# Patient Record
Sex: Female | Born: 1970 | Race: Asian | Hispanic: No | Marital: Single | Smoking: Never smoker
Health system: Southern US, Community
[De-identification: ages and names within clinical notes are randomized; demographics above are authoritative.]

## PROBLEM LIST (undated history)

## (undated) DIAGNOSIS — E079 Disorder of thyroid, unspecified: Secondary | ICD-10-CM

---

## 2003-08-30 ENCOUNTER — Emergency Department (HOSPITAL_COMMUNITY): Admission: EM | Admit: 2003-08-30 | Discharge: 2003-08-30 | Payer: Self-pay | Admitting: Emergency Medicine

## 2010-01-28 ENCOUNTER — Encounter: Admission: RE | Admit: 2010-01-28 | Discharge: 2010-01-28 | Payer: Self-pay | Admitting: Internal Medicine

## 2010-07-19 ENCOUNTER — Encounter: Payer: Self-pay | Admitting: Internal Medicine

## 2012-04-21 ENCOUNTER — Other Ambulatory Visit: Payer: Self-pay | Admitting: Internal Medicine

## 2012-04-21 DIAGNOSIS — Z1231 Encounter for screening mammogram for malignant neoplasm of breast: Secondary | ICD-10-CM

## 2012-06-02 ENCOUNTER — Ambulatory Visit
Admission: RE | Admit: 2012-06-02 | Discharge: 2012-06-02 | Disposition: A | Discharge status: Home / Self Care | Payer: PRIVATE HEALTH INSURANCE | Source: Ambulatory Visit | Attending: Internal Medicine | Admitting: Internal Medicine

## 2012-06-02 DIAGNOSIS — Z1231 Encounter for screening mammogram for malignant neoplasm of breast: Secondary | ICD-10-CM

## 2013-11-24 ENCOUNTER — Other Ambulatory Visit: Payer: Self-pay

## 2013-11-24 DIAGNOSIS — Z1231 Encounter for screening mammogram for malignant neoplasm of breast: Secondary | ICD-10-CM

## 2013-12-04 ENCOUNTER — Ambulatory Visit
Admission: RE | Admit: 2013-12-04 | Discharge: 2013-12-04 | Disposition: A | Discharge status: Home / Self Care | Payer: PRIVATE HEALTH INSURANCE | Source: Ambulatory Visit

## 2013-12-04 DIAGNOSIS — Z1231 Encounter for screening mammogram for malignant neoplasm of breast: Secondary | ICD-10-CM

## 2013-12-06 ENCOUNTER — Other Ambulatory Visit: Payer: Self-pay | Admitting: Internal Medicine

## 2013-12-06 DIAGNOSIS — R928 Other abnormal and inconclusive findings on diagnostic imaging of breast: Secondary | ICD-10-CM

## 2014-01-24 ENCOUNTER — Other Ambulatory Visit: Payer: PRIVATE HEALTH INSURANCE

## 2014-05-17 ENCOUNTER — Encounter (INDEPENDENT_AMBULATORY_CARE_PROVIDER_SITE_OTHER): Payer: Self-pay

## 2014-05-17 ENCOUNTER — Ambulatory Visit
Admission: RE | Admit: 2014-05-17 | Discharge: 2014-05-17 | Disposition: A | Discharge status: Home / Self Care | Payer: Managed Care, Other (non HMO) | Source: Ambulatory Visit | Attending: Internal Medicine | Admitting: Internal Medicine

## 2014-05-17 DIAGNOSIS — R928 Other abnormal and inconclusive findings on diagnostic imaging of breast: Secondary | ICD-10-CM

## 2017-07-16 ENCOUNTER — Other Ambulatory Visit: Payer: Self-pay | Admitting: Internal Medicine

## 2017-07-16 DIAGNOSIS — N6489 Other specified disorders of breast: Secondary | ICD-10-CM

## 2017-08-02 ENCOUNTER — Encounter: Payer: Self-pay | Admitting: Radiology

## 2017-08-02 ENCOUNTER — Ambulatory Visit
Admission: RE | Admit: 2017-08-02 | Discharge: 2017-08-02 | Disposition: A | Discharge status: Home / Self Care | Payer: 59 | Source: Ambulatory Visit | Attending: Internal Medicine | Admitting: Internal Medicine

## 2017-08-02 DIAGNOSIS — N6489 Other specified disorders of breast: Secondary | ICD-10-CM

## 2019-01-19 ENCOUNTER — Encounter (HOSPITAL_COMMUNITY): Payer: Self-pay | Admitting: Emergency Medicine

## 2019-01-19 ENCOUNTER — Other Ambulatory Visit: Payer: Self-pay

## 2019-01-19 ENCOUNTER — Emergency Department (HOSPITAL_COMMUNITY): Payer: 59

## 2019-01-19 ENCOUNTER — Emergency Department (HOSPITAL_COMMUNITY)
Admission: EM | Admit: 2019-01-19 | Discharge: 2019-01-20 | Disposition: A | Discharge status: Home / Self Care | Payer: 59 | Attending: Emergency Medicine | Admitting: Emergency Medicine

## 2019-01-19 DIAGNOSIS — E039 Hypothyroidism, unspecified: Secondary | ICD-10-CM | POA: Insufficient documentation

## 2019-01-19 DIAGNOSIS — R51 Headache: Secondary | ICD-10-CM | POA: Diagnosis not present

## 2019-01-19 DIAGNOSIS — R0789 Other chest pain: Secondary | ICD-10-CM | POA: Diagnosis not present

## 2019-01-19 DIAGNOSIS — R42 Dizziness and giddiness: Secondary | ICD-10-CM

## 2019-01-19 DIAGNOSIS — R519 Headache, unspecified: Secondary | ICD-10-CM

## 2019-01-19 HISTORY — DX: Disorder of thyroid, unspecified: E07.9

## 2019-01-19 LAB — CBC
HCT: 37.4 % (ref 36.0–46.0)
Hemoglobin: 12.3 g/dL (ref 12.0–15.0)
MCH: 30.3 pg (ref 26.0–34.0)
MCHC: 32.9 g/dL (ref 30.0–36.0)
MCV: 92.1 fL (ref 80.0–100.0)
Platelets: 347 10*3/uL (ref 150–400)
RBC: 4.06 MIL/uL (ref 3.87–5.11)
RDW: 12.6 % (ref 11.5–15.5)
WBC: 6.5 10*3/uL (ref 4.0–10.5)
nRBC: 0 % (ref 0.0–0.2)

## 2019-01-19 LAB — BASIC METABOLIC PANEL
Anion gap: 9 (ref 5–15)
BUN: 15 mg/dL (ref 6–20)
CO2: 23 mmol/L (ref 22–32)
Calcium: 8.8 mg/dL — ABNORMAL LOW (ref 8.9–10.3)
Chloride: 103 mmol/L (ref 98–111)
Creatinine, Ser: 0.69 mg/dL (ref 0.44–1.00)
GFR calc Af Amer: >60 mL/min (ref >60)
GFR calc non Af Amer: >60 mL/min (ref >60)
Glucose, Bld: 127 mg/dL — ABNORMAL HIGH (ref 70–99)
Potassium: 3.4 mmol/L — ABNORMAL LOW (ref 3.5–5.1)
Sodium: 135 mmol/L (ref 135–145)

## 2019-01-19 LAB — I-STAT BETA HCG BLOOD, ED (MC, WL, AP ONLY): I-stat hCG, quantitative: <5 m[IU]/mL (ref <5)

## 2019-01-19 LAB — TROPONIN I (HIGH SENSITIVITY): Troponin I (High Sensitivity): <2 ng/L (ref <18)

## 2019-01-19 MED ORDER — SODIUM CHLORIDE 0.9% FLUSH: 3.0000 mL | Freq: Once | INTRAVENOUS | Status: DC

## 2019-01-19 NOTE — ED Triage Notes (Signed)
Pt reports feeling lightheaded and head pressure starting 4 hours ago. Pt reports central chest pressure with no radiation. Pt reports nausea with 1 episode of vomiting. Pt reports hx of hypothyroidism.

## 2019-01-20 LAB — TROPONIN I (HIGH SENSITIVITY): Troponin I (High Sensitivity): <2 ng/L (ref <18)

## 2019-01-20 MED ORDER — DEXAMETHASONE SODIUM PHOSPHATE 10 MG/ML IJ SOLN
10.0000 mg | Freq: Once | INTRAMUSCULAR | Status: AC
  Administered 2019-01-20: 03:00:00 10 mg via INTRAVENOUS
  Filled 2019-01-20: qty 1

## 2019-01-20 MED ORDER — DIPHENHYDRAMINE HCL 50 MG/ML IJ SOLN
25.0000 mg | Freq: Once | INTRAMUSCULAR | Status: AC
  Administered 2019-01-20: 03:00:00 25 mg via INTRAVENOUS
  Filled 2019-01-20: qty 1

## 2019-01-20 MED ORDER — PROCHLORPERAZINE EDISYLATE 10 MG/2ML IJ SOLN
10.0000 mg | Freq: Once | INTRAMUSCULAR | Status: AC
  Administered 2019-01-20: 03:00:00 10 mg via INTRAVENOUS
  Filled 2019-01-20: qty 2

## 2019-01-20 MED ORDER — SODIUM CHLORIDE 0.9 % IV BOLUS
500.0000 mL | Freq: Once | INTRAVENOUS | Status: AC
  Administered 2019-01-20: 03:00:00 500 mL via INTRAVENOUS

## 2019-01-20 NOTE — ED Notes (Signed)
IV placed in R Hand. Patient is resting comfortably in bed with pain subsiding from a 7 to a 3.

## 2019-01-20 NOTE — ED Provider Notes (Signed)
[facility]  CSN: [id number] Arrival date & time: 01/19/19 2052  Chief Complaint(s) Dizziness and Chest Pain  HPI Lindsay Atkinson is a 48 y.o. female who presents to the emergency department with frontal headache and dizziness.  Patient also reports having substernal chest pressure while waiting in the emergency department waiting room.  Headache is frontal and calvarial pressure.  Endorses photophobia.  No phonophobia.  Reports history of similar headaches in the past.  Denies any recent fevers or infections.  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  She reports being tested weekly and just received a negative test.  She denies any nasal congestion, cough.  No visual disturbance no focal deficits.  Pain has improved while waiting in the emergency department, but not resolved.  Still has dizziness described as lightheadedness upon standing and sitting up.  Reports drinking in lots of coffee and soda and minimal water.  Patient endorsed one episode of nausea with nonbloody nonbilious emesis.  Chest pain was described as substernal pressure that was nonradiating.  Lasted about 5 minutes.  Nonexertional.  No associated shortness of breath.  Resolved spontaneously.  This was following episode of emesis.  No prior cardiac history.   HPI  Past Medical History Past Medical History:  Diagnosis Date  . Thyroid disease    hypothyroid   There are no active problems to display for this patient.  Home Medication(s) Prior to Admission medications   Medication Sig Start Date End Date Taking? Authorizing Provider  Cholecalciferol (VITAMIN D) 50 MCG (2000 UT) tablet Take 2,000 Units by mouth daily.   Yes [provider]  traMADol (ULTRAM) 50 MG tablet Take 50 mg by mouth every 6 (six) hours as needed for moderate pain.   Yes [provider]  vitamin C (ASCORBIC ACID) 500 MG tablet Take 500 mg by  mouth daily.   Yes [provider]  zinc sulfate 220 (50 Zn) MG capsule Take 220 mg by mouth daily.   Yes [provider]                                                                                                                                    Past Surgical History History reviewed. No pertinent surgical history. Family History Family History  Problem Relation Age of Onset  . Breast cancer Sister 4224    Social History Social History   Tobacco Use  . Smoking status: Never Smoker  . Smokeless tobacco: Never Used  Substance Use Topics  . Alcohol use: Yes    Comment: occasionally  . Drug use: Never   Allergies Patient has no known allergies.  Review of Systems Review of Systems All other systems are reviewed and are negative for acute change except as noted in the HPI  Physical Exam Vital Signs  I have reviewed the triage vital signs BP  138/84 (BP Location: Right Arm)   Pulse 77   Temp 98.1 F (36.7 C) (Oral)   Resp 18   Ht 4\' 10"  (1.473 m)   Wt 49.9 kg   LMP 01/12/2019   SpO2 100%   BMI 22.99 kg/m   Physical Exam Vitals signs reviewed.  Constitutional:      General: She is not in acute distress.    Appearance: She is well-developed. She is not diaphoretic.  HENT:     Head: Normocephalic and atraumatic.     Nose: Nose normal.  Eyes:     General: No scleral icterus.       Right eye: No discharge.        Left eye: No discharge.     Conjunctiva/sclera: Conjunctivae normal.     Pupils: Pupils are equal, round, and reactive to light.  Neck:     Musculoskeletal: Normal range of motion and neck supple.  Cardiovascular:     Rate and Rhythm: Normal rate and regular rhythm.     Heart sounds: No murmur. No friction rub. No gallop.   Pulmonary:     Effort: Pulmonary effort is normal. No respiratory distress.     Breath sounds: Normal breath sounds. No stridor. No rales.  Abdominal:     General: There is no distension.     Palpations:  Abdomen is soft.     Tenderness: There is no abdominal tenderness.  Musculoskeletal:        General: No tenderness.  Skin:    General: Skin is warm and dry.     Findings: No erythema or rash.  Neurological:     Mental Status: She is alert and oriented to person, place, and time.     Comments: Mental Status:  Alert and oriented to person, place, and time.  Attention and concentration normal.  Speech clear.  Recent memory is intact  Cranial Nerves:  II Visual Fields: Intact to confrontation. Visual fields intact. III, IV, VI: Pupils equal and reactive to light and near. Full eye movement without nystagmus  V Facial Sensation: Normal. No weakness of masticatory muscles  VII: No facial weakness or asymmetry  VIII Auditory Acuity: Grossly normal  IX/X: The uvula is midline; the palate elevates symmetrically  XI: Normal sternocleidomastoid and trapezius strength  XII: The tongue is midline. No atrophy or fasciculations.   Motor System: Muscle Strength: 5/5 and symmetric in the upper and lower extremities. No pronation or drift.  Muscle Tone: Tone and muscle bulk are normal in the upper and lower extremities.   Reflexes: DTRs: 1+ and symmetrical in all four extremities. No Clonus Coordination:  No tremor.  Sensation: Intact to light touch  Gait: Routine gait normal.      ED Results and Treatments Labs (all labs ordered are listed, but only abnormal results are displayed) Labs Reviewed  BASIC METABOLIC PANEL - Abnormal; Notable for the following components:      Result Value   Potassium 3.4 (*)    Glucose, Bld 127 (*)    Calcium 8.8 (*)    All other components within normal limits  CBC  I-STAT BETA HCG BLOOD, ED ([facility], [facility], [facility] ONLY)  TROPONIN I (HIGH SENSITIVITY)  TROPONIN I (HIGH SENSITIVITY)  EKG  EKG Interpretation  Date/Time:  Thursday January 19 2019  20:56:42 EDT Ventricular Rate:  60 PR Interval:  142 QRS Duration: 70 QT Interval:  464 QTC Calculation: 464 R Axis:   66 Text Interpretation:  Normal sinus rhythm Normal ECG NO STEMI. No old tracing to compare Confirmed by Drema Pryardama, Kamarion Zagami [phone] on 01/20/2019 1:25:46 AM      Radiology Dg Chest 2 View  Result Date: 01/19/2019 CLINICAL DATA:  Left-sided chest pain and shortness of breath EXAM: CHEST - 2 VIEW COMPARISON:  None. FINDINGS: The heart size and mediastinal contours are within normal limits. Both lungs are clear. The visualized skeletal structures are unremarkable. IMPRESSION: No active cardiopulmonary disease. Electronically Signed   By: Alcide CleverMark  Lukens M.D.   On: 01/19/2019 21:24    Pertinent labs & imaging results that were available during my care of the patient were reviewed by me and considered in my medical decision making (see chart for details).  Medications Ordered in ED Medications  sodium chloride flush (NS) 0.9 % injection 3 mL (has no administration in time range)  diphenhydrAMINE (BENADRYL) injection 25 mg (25 mg Intravenous Given 01/20/19 0243)  prochlorperazine (COMPAZINE) injection 10 mg (10 mg Intravenous Given 01/20/19 0307)  dexamethasone (DECADRON) injection 10 mg (10 mg Intravenous Given 01/20/19 0241)  sodium chloride 0.9 % bolus 500 mL (0 mLs Intravenous Stopped 01/20/19 [id number])                                                                                                                                    Procedures Procedures  (including critical care time)  Medical Decision Making / ED Course I have reviewed the nursing notes for this encounter and the patient's prior records (if available in EHR or on provided paperwork).   Lindsay Atkinson was evaluated in Emergency Department on 01/20/2019 for the symptoms described in the history of present illness. She was evaluated in the context of the global COVID-19 pandemic, which necessitated consideration  that the patient might be at risk for infection with the SARS-CoV-2 virus that causes COVID-19. Institutional protocols and algorithms that pertain to the evaluation of patients at risk for COVID-19 are in a state of rapid change based on information released by regulatory bodies including the CDC and federal and state organizations. These policies and algorithms were followed during the patient's care in the ED.    1. Headache Typical headache for the pt. Non focal neuro exam. No recent head trauma. No fever. Doubt meningitis. Doubt intracranial bleed. Doubt IIH. No indication for imaging. Will treat with migraine cocktail and reevaluate.  Complete resolution following cocktail.   2. Chest pain Highly atypical for ACS.  EKG without acute ischemic changes or evidence of pericarditis.  Serial troponin negative x2.  Low suspicion for pulmonary embolism.  Presentation not classic for aortic dissection. Chest x-ray without evidence suggestive of pneumonia, pneumothorax, pneumomediastinum.  No abnormal contour  of the mediastinum to suggest dissection. No evidence of acute injuries.  The patient appears reasonably screened and/or stabilized for discharge and I doubt any other medical condition or other [facility] requiring further screening, evaluation, or treatment in the ED at this time prior to discharge.  The patient is safe for discharge with strict return precautions.       Final Clinical Impression(s) / ED Diagnoses Final diagnoses:  Bad headache  Dizziness  Atypical chest pain     The patient appears reasonably screened and/or stabilized for discharge and I doubt any other medical condition or other [facility] requiring further screening, evaluation, or treatment in the ED at this time prior to discharge.  Disposition: Discharge  Condition: Good  I have discussed the results, Dx and Tx plan with the patient who expressed understanding and agree(s) with the plan. Discharge instructions  discussed at great length. The patient was given strict return precautions who verbalized understanding of the instructions. No further questions at time of discharge.    ED Discharge Orders    None        Follow Up: Primary care provider  Schedule an appointment as soon as possible for a visit       This chart was dictated using voice recognition software.  Despite best efforts to proofread,  errors can occur which can change the documentation meaning.   Nira Connardama, Tyaisha Cullom Eduardo, MD 01/20/19 [id number]

## 2019-01-20 NOTE — ED Notes (Signed)
Orthostatic V/S: Lying: 164/81 BP, 71 HR Sitting: 164/87 BP, 65 HR Standing: 178/90 BP, 64 HR

## 2019-01-20 NOTE — ED Notes (Signed)
Pt no longer complains of chest pain but is still lightheaded. Orthostatic V/S are negative but pt is unsteady when standing. Pt is no longer nauseas but feels weak. Pt reports photosensitivity.

## 2020-05-03 ENCOUNTER — Ambulatory Visit (INDEPENDENT_AMBULATORY_CARE_PROVIDER_SITE_OTHER): Payer: 59

## 2020-05-03 ENCOUNTER — Other Ambulatory Visit: Payer: Self-pay

## 2020-05-03 ENCOUNTER — Ambulatory Visit
Admission: RE | Admit: 2020-05-03 | Discharge: 2020-05-03 | Disposition: A | Discharge status: Home / Self Care | Payer: 59 | Source: Ambulatory Visit

## 2020-05-03 VITALS — BP 115/81 | HR 92 | Temp 98.8°F | Resp 18

## 2020-05-03 DIAGNOSIS — M79671 Pain in right foot: Secondary | ICD-10-CM | POA: Diagnosis not present

## 2020-05-03 DIAGNOSIS — W11XXXA Fall on and from ladder, initial encounter: Secondary | ICD-10-CM

## 2020-05-03 DIAGNOSIS — S92331A Displaced fracture of third metatarsal bone, right foot, initial encounter for closed fracture: Secondary | ICD-10-CM

## 2020-05-03 NOTE — ED Triage Notes (Signed)
Pt states fell off a step ladder last night and rt foot went through ladder and then it fell on top of rt foot. Pt c/o pain to top of rt foot and rt last three toes with bruising noted. State unable to bare weight.

## 2020-05-03 NOTE — Discharge Instructions (Signed)
WEIGHT BEARING AS TOLERATED  RICE: rest, ice, compression, elevation as needed for pain.    Pain medication:  350 mg-1000 mg of Tylenol (acetaminophen) and/or 200 mg - 800 mg of Advil (ibuprofen, Motrin) every 8 hours as needed.  May alternate between the two throughout the day as they are generally safe to take together.  DO NOT exceed more than 3000 mg of Tylenol or 3200 mg of ibuprofen in a 24 hour period as this could damage your stomach, kidneys, liver, or increase your bleeding risk.   Important to follow up with specialist(s) below for further evaluation/management if your symptoms persist or worsen.

## 2020-05-03 NOTE — ED Provider Notes (Signed)
[name]    CSN: [id number] Arrival date & time: 05/03/20  0957      History   Chief Complaint Chief Complaint  Patient presents with  . appt 10-foot injury    HPI Lindsay Atkinson is a 49 y.o. female  Any for right foot pain s/p injury last night.  Patient was stepping off a ladder when she misstepped and crashed down.  States that her third toe appeared deformed so she "put it back ".  Denies numbness, deformity at this time.  Did note mild bruising this morning.  Past Medical History:  Diagnosis Date  . Thyroid disease    hypothyroid    There are no problems to display for this patient.   History reviewed. No pertinent surgical history.  OB History   No obstetric history on file.      Home Medications    Prior to Admission medications   Medication Sig Start Date End Date Taking? Authorizing Provider  Fremanezumab-vfrm (AJOVY [place]) Inject into the skin.   Yes [provider]  levothyroxine (SYNTHROID) 50 MCG tablet Take 50 mcg by mouth daily before breakfast.   Yes [provider]  Cholecalciferol (VITAMIN D) 50 MCG (2000 UT) tablet Take 2,000 Units by mouth daily.    [provider]  vitamin C (ASCORBIC ACID) 500 MG tablet Take 500 mg by mouth daily.    [provider]    Family History Family History  Problem Relation Age of Onset  . Breast cancer Sister 71    Social History Social History   Tobacco Use  . Smoking status: Never Smoker  . Smokeless tobacco: Never Used  Substance Use Topics  . Alcohol use: Yes    Comment: occasionally  . Drug use: Never     Allergies   Patient has no known allergies.   Review of Systems Review of Systems  Constitutional: Negative for fatigue and fever.  HENT: Negative for ear pain, sinus pain, sore throat and voice change.   Eyes: Negative for pain, redness and visual disturbance.  Respiratory: Negative for cough and shortness of breath.   Cardiovascular:  Negative for chest pain and palpitations.  Gastrointestinal: Negative for abdominal pain, diarrhea and vomiting.  Musculoskeletal: Positive for gait problem and joint swelling.  Skin: Negative for rash and wound.  Neurological: Negative for syncope and headaches.     Physical Exam Triage Vital Signs ED Triage Vitals [05/03/20 1026]  Enc Vitals Group     BP 115/81     Pulse Rate 92     Resp 18     Temp 98.8 F (37.1 C)     Temp Source Oral     SpO2 97 %     Weight      Height      Head Circumference      Peak Flow      Pain Score 10     Pain Loc      Pain Edu?      Excl. in GC?    No data found.  Updated Vital Signs BP 115/81 (BP Location: Left Arm)   Pulse 92   Temp 98.8 F (37.1 C) (Oral)   Resp 18   LMP 04/26/2020   SpO2 97%   Visual Acuity Right Eye Distance:   Left Eye Distance:   Bilateral Distance:    Right Eye Near:   Left Eye Near:    Bilateral Near:     Physical Exam Constitutional:  General: She is not in acute distress. HENT:     Head: Normocephalic and atraumatic.  Eyes:     General: No scleral icterus.    Pupils: Pupils are equal, round, and reactive to light.  Cardiovascular:     Rate and Rhythm: Normal rate.  Pulmonary:     Effort: Pulmonary effort is normal.  Musculoskeletal:        General: Swelling and tenderness present. No deformity.     Comments: R foot, MTP 2-4.  NVI.  Skin:    Capillary Refill: Capillary refill takes less than 2 seconds.     Coloration: Skin is not jaundiced or pale.     Findings: Bruising present.  Neurological:     General: No focal deficit present.     Mental Status: She is alert and oriented to person, place, and time.      UC Treatments / Results  Labs (all labs ordered are listed, but only abnormal results are displayed) Labs Reviewed - No data to display  EKG   Radiology DG Foot Complete Right  Result Date: 05/03/2020 CLINICAL DATA:  Right foot pain and bruising after a fall from a  ladder. EXAM: RIGHT FOOT COMPLETE - 3+ VIEW COMPARISON:  None. FINDINGS: There is an oblique, mildly displaced fracture of the neck of the third metatarsal. There is mild overlying soft tissue swelling. There is no dislocation. Hallux valgus is noted with mild hypertrophic changes of the first metatarsal head. IMPRESSION: Mildly displaced third metatarsal fracture. Electronically Signed   By: Sebastian Ache M.D.   On: 05/03/2020 10:54    Procedures Procedures (including critical care time)  Medications Ordered in UC Medications - No data to display  Initial Impression / Assessment and Plan / UC Course  I have reviewed the triage vital signs and the nursing notes.  Pertinent labs & imaging results that were available during my care of the patient were reviewed by me and considered in my medical decision making (see chart for details).     X-ray with mildly displaced third metatarsal fracture.  Consulted with Dr. Aundria Rud who agrees with A/P: Given postop shoe, weight-bear as tolerated, follow-up in 2 weeks.  Work note provided.  Return precautions discussed, pt verbalized understanding and is agreeable to plan. Final Clinical Impressions(s) / UC Diagnoses   Final diagnoses:  Closed displaced fracture of third metatarsal bone of right foot, initial encounter     Discharge Instructions     WEIGHT BEARING AS TOLERATED  RICE: rest, ice, compression, elevation as needed for pain.    Pain medication:  350 mg-1000 mg of Tylenol (acetaminophen) and/or 200 mg - 800 mg of Advil (ibuprofen, Motrin) every 8 hours as needed.  May alternate between the two throughout the day as they are generally safe to take together.  DO NOT exceed more than 3000 mg of Tylenol or 3200 mg of ibuprofen in a 24 hour period as this could damage your stomach, kidneys, liver, or increase your bleeding risk.   Important to follow up with specialist(s) below for further evaluation/management if your symptoms persist or  worsen.    ED Prescriptions    None     PDMP not reviewed this encounter.   Hall[place], [place], [place] 05/03/20 1119

## 2020-08-19 ENCOUNTER — Other Ambulatory Visit: Payer: Self-pay | Admitting: Internal Medicine

## 2020-08-19 DIAGNOSIS — Z1231 Encounter for screening mammogram for malignant neoplasm of breast: Secondary | ICD-10-CM

## 2020-10-09 ENCOUNTER — Other Ambulatory Visit: Payer: Self-pay

## 2020-10-09 ENCOUNTER — Ambulatory Visit
Admission: RE | Admit: 2020-10-09 | Discharge: 2020-10-09 | Disposition: A | Discharge status: Home / Self Care | Payer: 59 | Source: Ambulatory Visit | Attending: Internal Medicine | Admitting: Internal Medicine

## 2020-10-09 DIAGNOSIS — Z1231 Encounter for screening mammogram for malignant neoplasm of breast: Secondary | ICD-10-CM

## 2022-01-16 IMAGING — MG MM DIGITAL SCREENING BILAT W/ TOMO AND CAD
8 series · 9 of 24 positions shown · non-contrast
Comparison: Previous exam(s).

CLINICAL DATA: Screening.

EXAM:
DIGITAL SCREENING BILATERAL MAMMOGRAM WITH TOMOSYNTHESIS AND CAD
TECHNIQUE: Bilateral screening digital craniocaudal and mediolateral oblique
mammograms were obtained. Bilateral screening digital breast
tomosynthesis was performed. The images were evaluated with
computer-aided detection.

[R CC synth-2D]
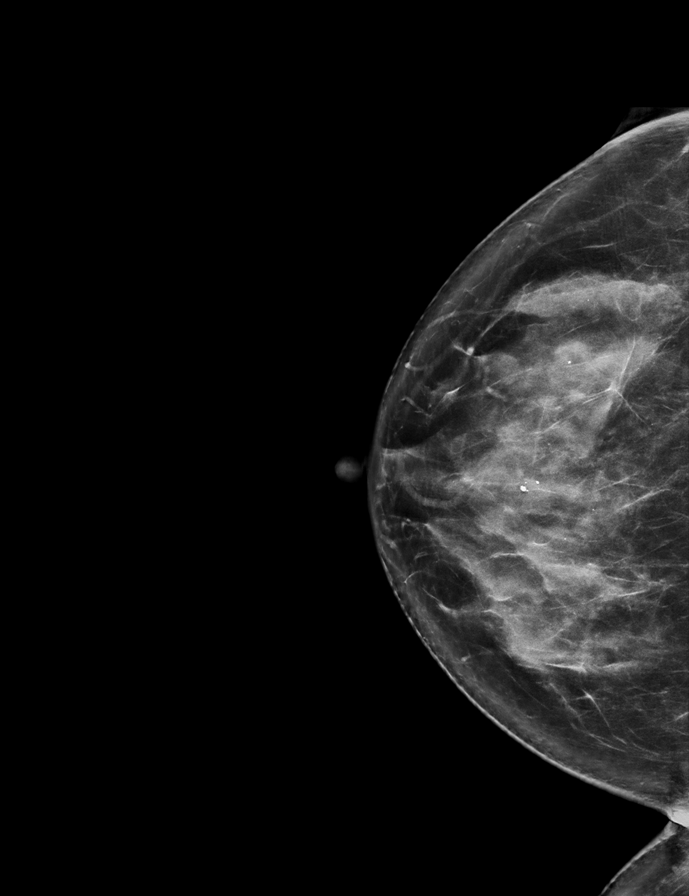

[R MLO synth-2D]
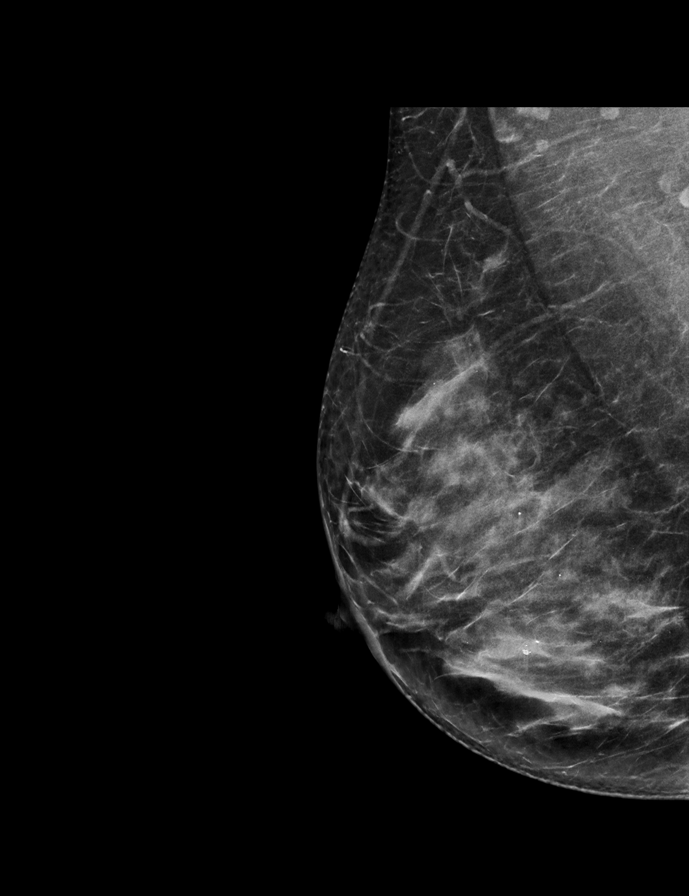

[L CC synth-2D]
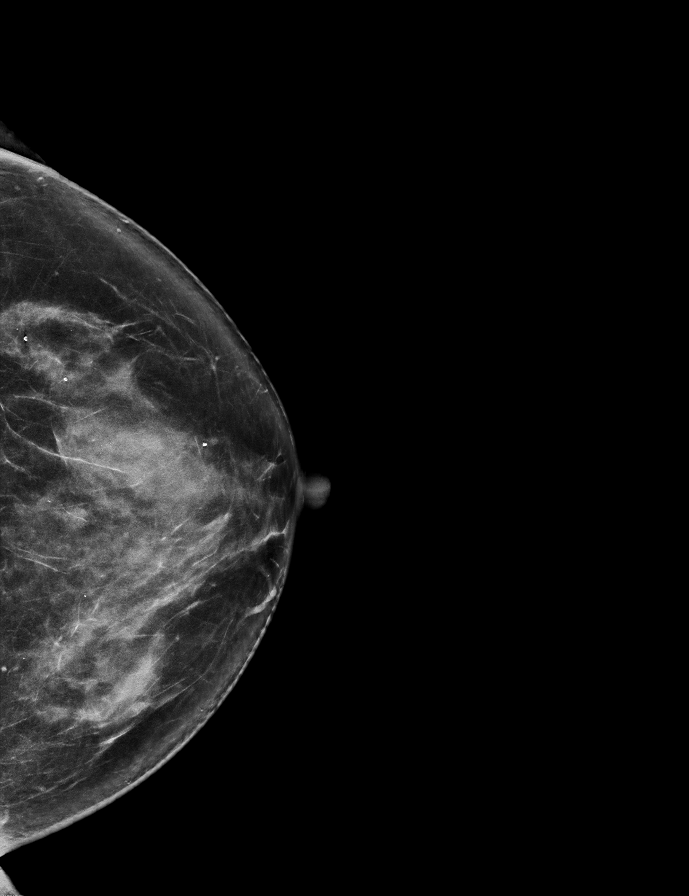

[L MLO synth-2D]
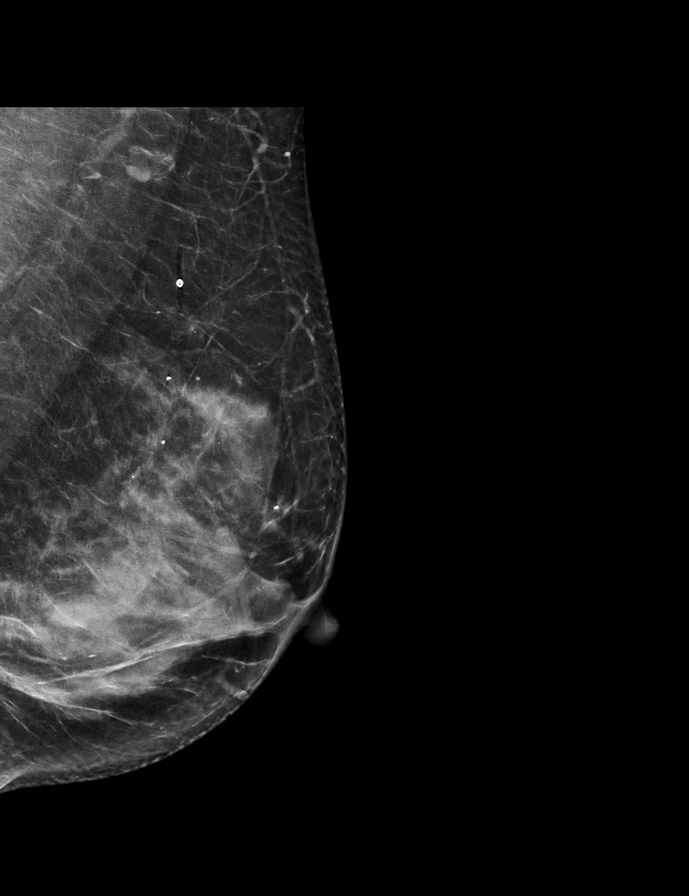

[R CC tomo · 2 of 75 frames shown]
[frame 25/75]
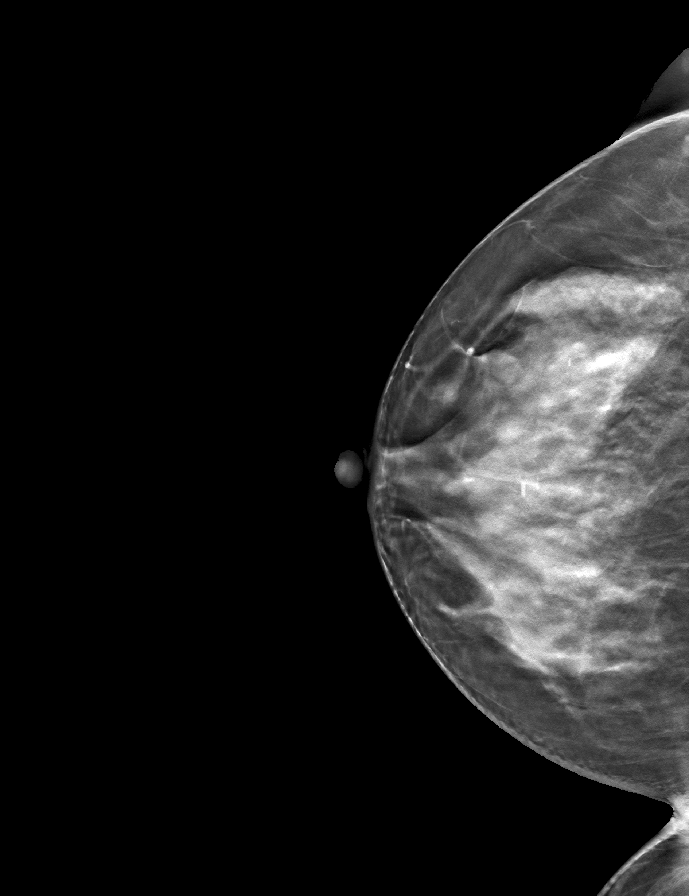
[frame 38/75]
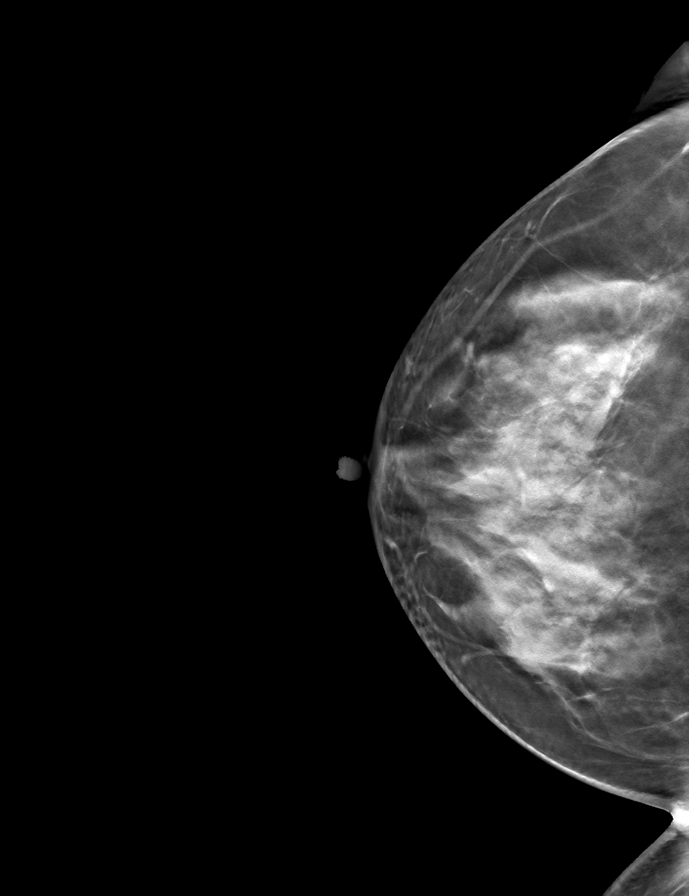

[L MLO tomo · tomo slice 35/70.0]
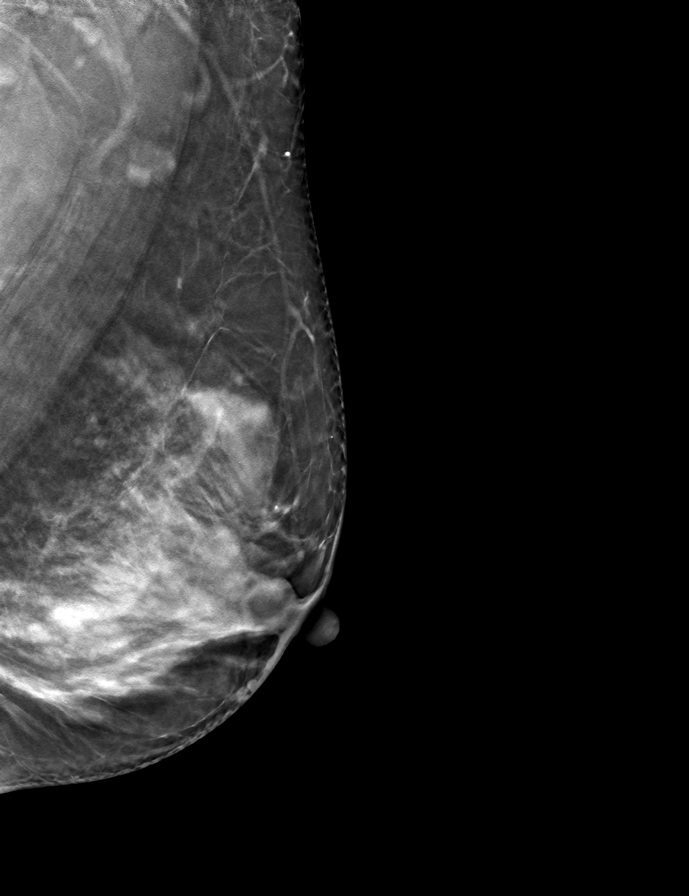

[R MLO tomo · tomo slice 35/68.0]
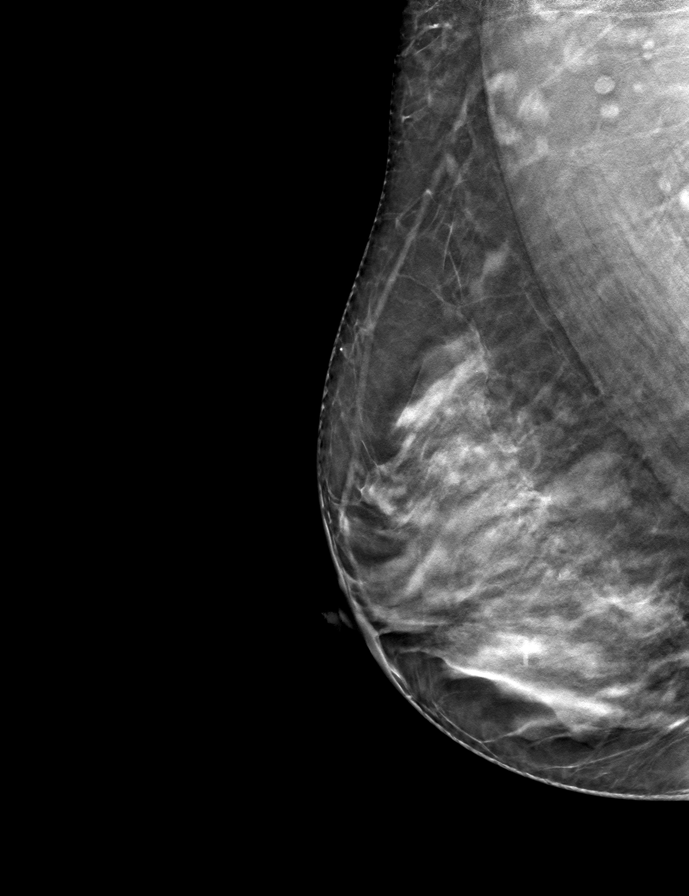

[L CC tomo · tomo slice 35/69.0]
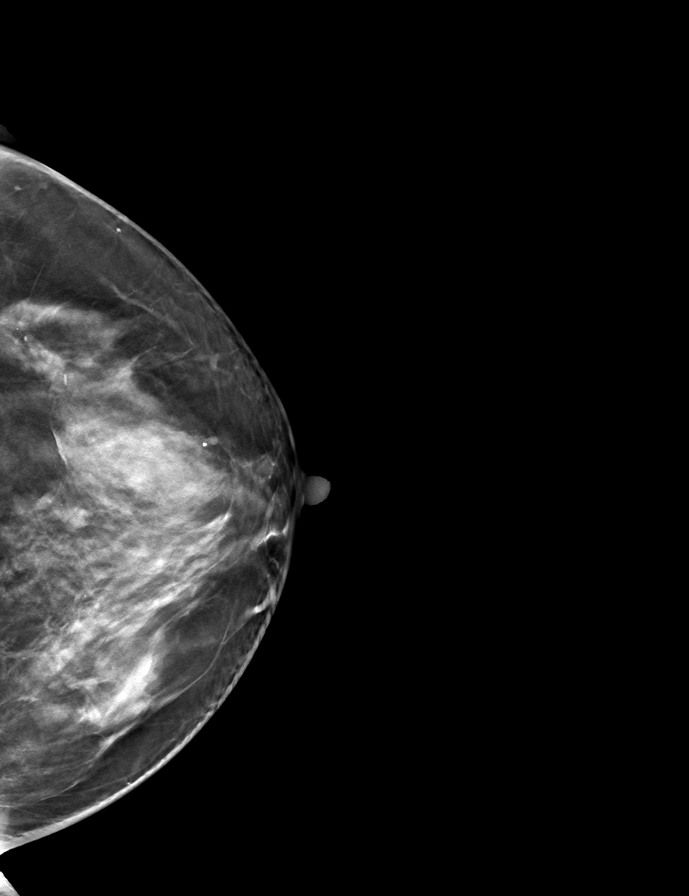

[9 of 24 positions shown; findings below may reference images not displayed]

ACR Breast Density Category d: The breast tissue is extremely dense,
which lowers the sensitivity of mammography
FINDINGS: There are no findings suspicious for malignancy. The images were
evaluated with computer-aided detection.
IMPRESSION: No mammographic evidence of malignancy. A result letter of this
screening mammogram will be mailed directly to the patient.

RECOMMENDATION:
Screening mammogram in one year. (Code:95-0-E9V)

BI-RADS CATEGORY  1: Negative.

## 2022-09-29 ENCOUNTER — Other Ambulatory Visit: Payer: Self-pay | Admitting: Internal Medicine

## 2022-09-29 DIAGNOSIS — Z1231 Encounter for screening mammogram for malignant neoplasm of breast: Secondary | ICD-10-CM

## 2022-11-10 ENCOUNTER — Ambulatory Visit
Admission: RE | Admit: 2022-11-10 | Discharge: 2022-11-10 | Disposition: A | Discharge status: Home / Self Care | Payer: 59 | Source: Ambulatory Visit | Attending: Internal Medicine | Admitting: Internal Medicine

## 2022-11-10 DIAGNOSIS — Z1231 Encounter for screening mammogram for malignant neoplasm of breast: Secondary | ICD-10-CM

## 2024-01-07 ENCOUNTER — Other Ambulatory Visit: Payer: Self-pay | Admitting: Internal Medicine

## 2024-01-07 DIAGNOSIS — Z1231 Encounter for screening mammogram for malignant neoplasm of breast: Secondary | ICD-10-CM

## 2024-01-12 ENCOUNTER — Ambulatory Visit
Admission: RE | Admit: 2024-01-12 | Discharge: 2024-01-12 | Disposition: A | Discharge status: Home / Self Care | Source: Ambulatory Visit | Attending: Internal Medicine | Admitting: Internal Medicine

## 2024-01-12 DIAGNOSIS — Z1231 Encounter for screening mammogram for malignant neoplasm of breast: Secondary | ICD-10-CM
